# Patient Record
Sex: Female | Born: 2000
Health system: Southern US, Community
[De-identification: ages and names within clinical notes are randomized; demographics above are authoritative.]

## PROBLEM LIST (undated history)

## (undated) DIAGNOSIS — F419 Anxiety disorder, unspecified: Secondary | ICD-10-CM

---
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
## 2021-02-22 IMAGING — DX PORTABLE CHEST - 1 VIEW
1 series · 1 of 1 positions shown · non-contrast
Comparison: No pertinent prior studies available for comparison.

CLINICAL DATA: Cough

EXAM:
PORTABLE CHEST 1 VIEW

[chest ap]
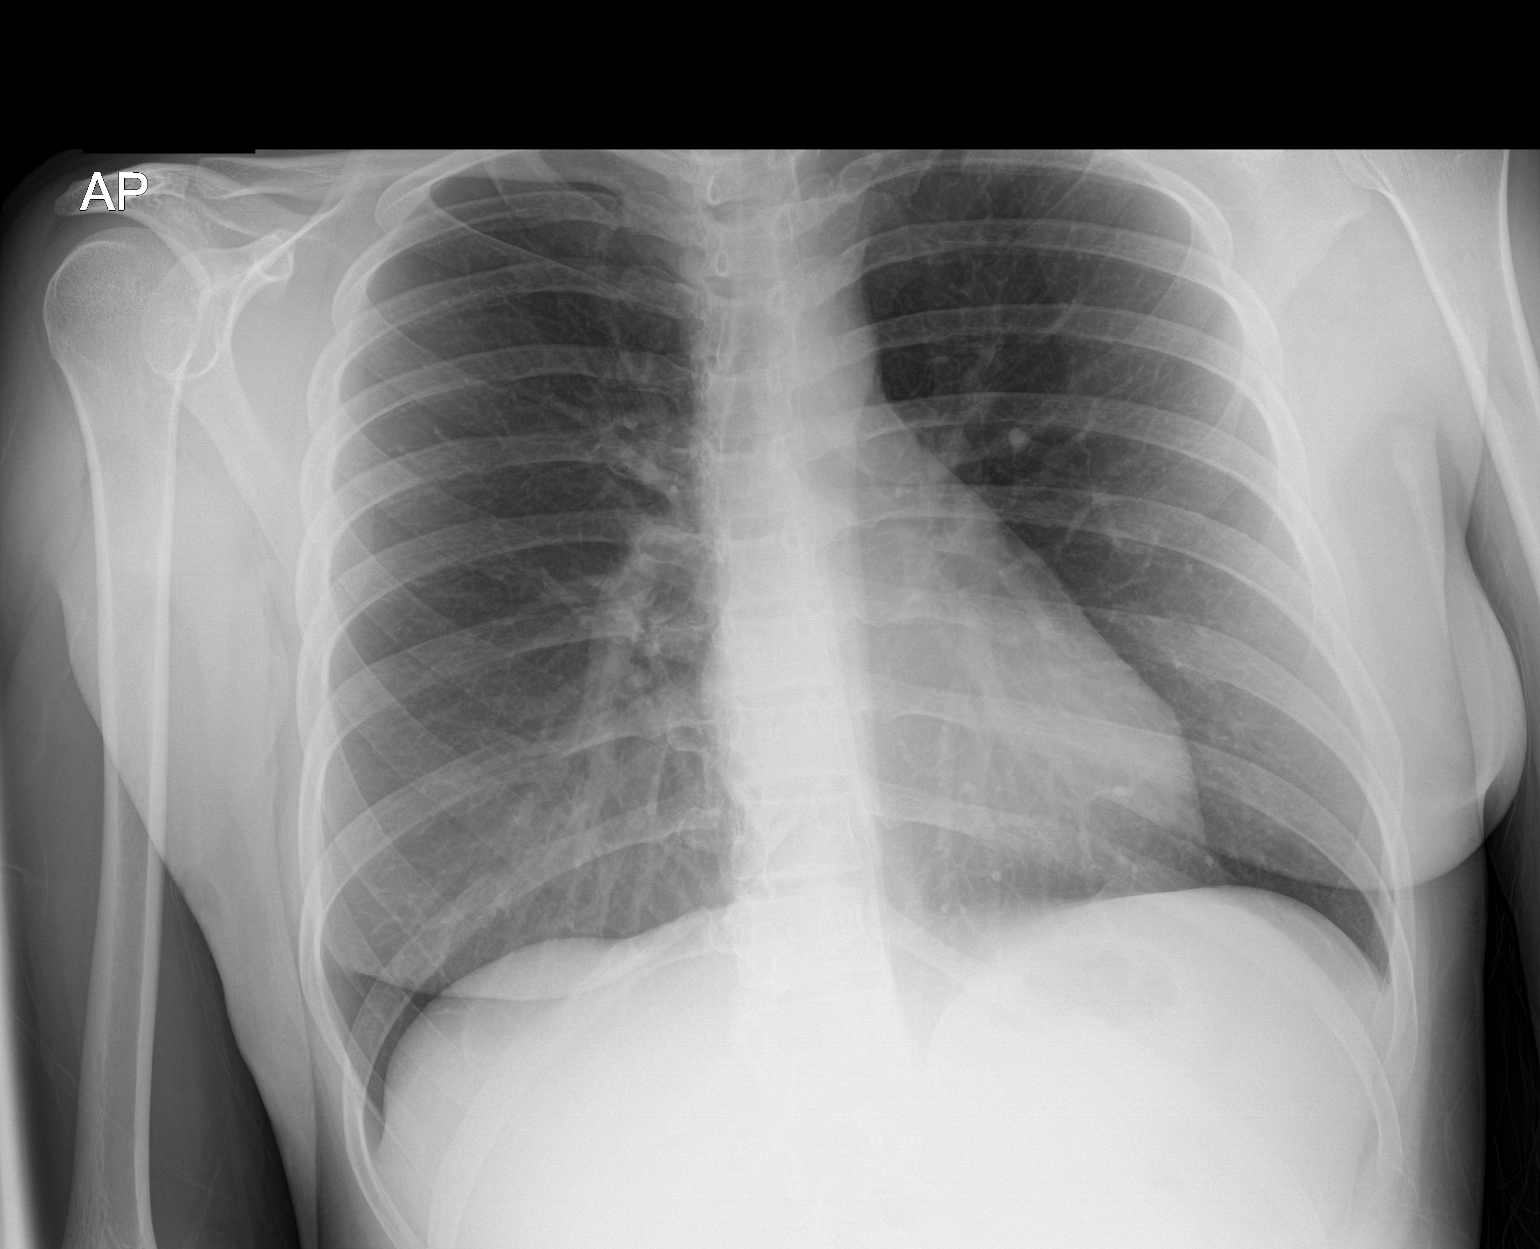

[1 of 1 positions shown; findings below may reference images not displayed]

FINDINGS: Heart size within normal limits. No focal consolidation within the
lungs. No evidence of pneumothorax or sizable pleural effusion. No
acute bony abnormality.
IMPRESSION: No evidence of active cardiopulmonary disease.
